# Patient Record
Sex: Female | Born: 1995 | Race: Black or African American | Hispanic: No | Marital: Single | Smoking: Never smoker
Health system: Southern US, Community
[De-identification: ages and names within clinical notes are randomized; demographics above are authoritative.]

---
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
## 2016-07-17 ENCOUNTER — Encounter (HOSPITAL_COMMUNITY): Payer: Self-pay

## 2016-07-17 ENCOUNTER — Emergency Department (HOSPITAL_COMMUNITY)
Admission: EM | Admit: 2016-07-17 | Discharge: 2016-07-17 | Disposition: A | Discharge status: Home / Self Care | Payer: Medicaid - Out of State | Attending: Emergency Medicine | Admitting: Emergency Medicine

## 2016-07-17 DIAGNOSIS — R21 Rash and other nonspecific skin eruption: Secondary | ICD-10-CM | POA: Diagnosis present

## 2016-07-17 DIAGNOSIS — L509 Urticaria, unspecified: Secondary | ICD-10-CM | POA: Insufficient documentation

## 2016-07-17 MED ORDER — DIPHENHYDRAMINE HCL 25 MG PO CAPS
25.0000 mg | ORAL_CAPSULE | Freq: Once | ORAL | Status: AC
  Administered 2016-07-17: 25 mg via ORAL
  Filled 2016-07-17: qty 1

## 2016-07-17 MED ORDER — LORATADINE 10 MG PO TABS
10.0000 mg | ORAL_TABLET | Freq: Once | ORAL | Status: AC
  Administered 2016-07-17: 10 mg via ORAL
  Filled 2016-07-17: qty 1

## 2016-07-17 MED ORDER — LORATADINE 10 MG PO TABS: 10.0000 mg | ORAL_TABLET | Freq: Every day | ORAL | 0 refills | Status: AC

## 2016-07-17 MED ORDER — DIPHENHYDRAMINE HCL 25 MG PO TABS: 25.0000 mg | ORAL_TABLET | Freq: Four times a day (QID) | ORAL | 0 refills | Status: AC

## 2016-07-17 NOTE — ED Triage Notes (Signed)
Pt states that she noticed hives on her stomach around 30 minutes ago. Denies allergies or use of new soaps. Denies pain. Hives almost gone, did not take anything PTA

## 2016-07-17 NOTE — ED Provider Notes (Signed)
[facility] Provider Note   CSN: [id number] Arrival date & time: 07/17/16  0120     History   Chief Complaint Chief Complaint  Patient presents with  . Rash    HPI Shelly Marsh is a 20 y.o. female.  HPI  Patient presents to the emergency department for evaluation of rash to abdomen. She does not know if she came into contact with noticed that her stomach was very itchy. Since then it has largely resolved. She does not know what she is allergic to. She is not had any shortness of breath, throat, lip or tongue swelling. She denied any nausea, vomiting, diarrhea. She denies having rash anywhere else.  History reviewed. No pertinent past medical history.  There are no active problems to display for this patient.   History reviewed. No pertinent surgical history.  OB History    No data available       Home Medications    Prior to Admission medications   Medication Sig Start Date End Date Taking? Authorizing Provider  albuterol (PROVENTIL HFA;VENTOLIN HFA) 108 (90 Base) MCG/ACT inhaler Inhale 1-2 puffs into the lungs every 6 (six) hours as needed for wheezing or shortness of breath.   Yes Historical Provider, MD  etonogestrel-ethinyl estradiol (NUVARING) 0.12-0.015 MG/24HR vaginal ring Place 1 each vaginally every 28 (twenty-eight) days. Insert vaginally and leave in place for 3 consecutive weeks, then remove for 1 week.   Yes Historical Provider, MD  metroNIDAZOLE (FLAGYL) 500 MG tablet Take 500 mg by mouth 2 (two) times daily.   Yes Historical Provider, MD  diphenhydrAMINE (BENADRYL) 25 MG tablet Take 1 tablet (25 mg total) by mouth every 6 (six) hours. 07/17/16   Alyda Megna Neva Seat, PA-C  loratadine (CLARITIN) 10 MG tablet Take 1 tablet (10 mg total) by mouth daily. 07/17/16   Marlon Pel, PA-C    Family History No family history on file.  Social History Social History  Substance Use Topics  . Smoking status: Never Smoker  . Smokeless tobacco: Never Used  .  Alcohol use No     Allergies   Review of patient's allergies indicates no known allergies.   Review of Systems Review of Systems  Review of Systems All other systems negative except as documented in the HPI. All pertinent positives and negatives as reviewed in the HPI.  Physical Exam Updated Vital Signs BP 133/91   Pulse 93   Temp 97.8 F (36.6 C)   Resp 18   Ht 5\' 4"  (1.626 m)   Wt 77.1 kg   LMP 05/15/2016   SpO2 99%   BMI 29.18 kg/m   Physical Exam  Constitutional: She appears well-developed and well-nourished. No distress.  HENT:  Head: Normocephalic and atraumatic.  Eyes: Pupils are equal, round, and reactive to light.  Neck: Normal range of motion. Neck supple.  Cardiovascular: Normal rate and regular rhythm.   Pulmonary/Chest: Effort normal.  Abdominal: Soft.  Neurological: She is alert.  Skin: Skin is warm and dry. Rash noted. Rash is urticarial (small amount of urticarial rash to abdomen).  Nursing note and vitals reviewed.   ED Treatments / Results  Labs (all labs ordered are listed, but only abnormal results are displayed) Labs Reviewed - No data to display  EKG  EKG Interpretation None       Radiology No results found.  Procedures Procedures (including critical care time)  Medications Ordered in ED Medications  diphenhydrAMINE (BENADRYL) capsule 25 mg (not administered)  loratadine (CLARITIN) tablet 10 mg (not  administered)     Initial Impression / Assessment and Plan / ED Course  I have reviewed the triage vital signs and the nursing notes.  Pertinent labs & imaging results that were available during my care of the patient were reviewed by me and considered in my medical decision making (see chart for details).  Clinical Course   Patient with urticarial eruption. No specific trigger. Discussed that urticaria can be trigger by stress heat cold and for unknown reasons. No signs of anaphylactic reaction; no new medications. Will treat  with . Follow up with PCP  in 2-3 days. Return precautions discussed. Pt is safe for discharge at this time.   Final Clinical Impressions(s) / ED Diagnoses   Final diagnoses:  Urticaria   New Prescriptions New Prescriptions   DIPHENHYDRAMINE (BENADRYL) 25 MG TABLET    Take 1 tablet (25 mg total) by mouth every 6 (six) hours.   LORATADINE (CLARITIN) 10 MG TABLET    Take 1 tablet (10 mg total) by mouth daily.     Marlon Peliffany Aletheia Tangredi, PA-C 07/17/16 0245    Gilda Creasehristopher J Pollina, MD 07/17/16 [phone]

## 2016-08-07 ENCOUNTER — Encounter (HOSPITAL_COMMUNITY): Payer: Self-pay | Admitting: Emergency Medicine

## 2016-08-07 ENCOUNTER — Emergency Department (HOSPITAL_COMMUNITY)
Admission: EM | Admit: 2016-08-07 | Discharge: 2016-08-07 | Disposition: A | Discharge status: Home / Self Care | Payer: Medicaid - Out of State | Attending: Emergency Medicine | Admitting: Emergency Medicine

## 2016-08-07 DIAGNOSIS — R51 Headache: Secondary | ICD-10-CM | POA: Diagnosis not present

## 2016-08-07 DIAGNOSIS — J4521 Mild intermittent asthma with (acute) exacerbation: Secondary | ICD-10-CM

## 2016-08-07 DIAGNOSIS — Y999 Unspecified external cause status: Secondary | ICD-10-CM | POA: Diagnosis not present

## 2016-08-07 DIAGNOSIS — R519 Headache, unspecified: Secondary | ICD-10-CM

## 2016-08-07 DIAGNOSIS — Y939 Activity, unspecified: Secondary | ICD-10-CM | POA: Insufficient documentation

## 2016-08-07 DIAGNOSIS — Y92410 Unspecified street and highway as the place of occurrence of the external cause: Secondary | ICD-10-CM | POA: Diagnosis not present

## 2016-08-07 DIAGNOSIS — Z79899 Other long term (current) drug therapy: Secondary | ICD-10-CM | POA: Diagnosis not present

## 2016-08-07 DIAGNOSIS — J45901 Unspecified asthma with (acute) exacerbation: Secondary | ICD-10-CM | POA: Insufficient documentation

## 2016-08-07 MED ORDER — ACETAMINOPHEN 325 MG PO TABS
650.0000 mg | ORAL_TABLET | Freq: Once | ORAL | Status: AC
  Administered 2016-08-07: 650 mg via ORAL
  Filled 2016-08-07: qty 2

## 2016-08-07 MED ORDER — PREDNISONE 50 MG PO TABS: 50.0000 mg | ORAL_TABLET | Freq: Every day | ORAL | 0 refills | Status: DC

## 2016-08-07 MED ORDER — PREDNISONE 20 MG PO TABS
60.0000 mg | ORAL_TABLET | Freq: Once | ORAL | Status: AC
  Administered 2016-08-07: 60 mg via ORAL
  Filled 2016-08-07: qty 3
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
## 2016-08-07 NOTE — Discharge Instructions (Signed)
Follow up with a primary care doctor, use the steroids with your inhaler, take tylenol or ibuprofen as needed for your headache
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
## 2017-09-09 ENCOUNTER — Encounter (HOSPITAL_COMMUNITY): Payer: Self-pay | Admitting: Family Medicine

## 2017-09-09 ENCOUNTER — Ambulatory Visit (HOSPITAL_COMMUNITY)
Admission: EM | Admit: 2017-09-09 | Discharge: 2017-09-09 | Disposition: A | Discharge status: Home / Self Care | Payer: Medicaid - Out of State | Attending: Family Medicine | Admitting: Family Medicine

## 2017-09-09 DIAGNOSIS — M26623 Arthralgia of bilateral temporomandibular joint: Secondary | ICD-10-CM

## 2017-09-09 MED ORDER — KETOROLAC TROMETHAMINE 30 MG/ML IJ SOLN
INTRAMUSCULAR | Status: AC
  Filled 2017-09-09: qty 1

## 2017-09-09 MED ORDER — PREDNISONE 10 MG (21) PO TBPK: ORAL_TABLET | Freq: Every day | ORAL | 0 refills | Status: AC

## 2017-09-09 MED ORDER — KETOROLAC TROMETHAMINE 30 MG/ML IJ SOLN
30.0000 mg | Freq: Once | INTRAMUSCULAR | Status: AC
  Administered 2017-09-09: 30 mg via INTRAMUSCULAR

## 2017-09-09 MED ORDER — DICLOFENAC SODIUM 75 MG PO TBEC: 75.0000 mg | DELAYED_RELEASE_TABLET | Freq: Two times a day (BID) | ORAL | 0 refills | Status: AC

## 2017-09-09 NOTE — ED Triage Notes (Signed)
Pt here for 8 days of jaw pain. Reports she believes it may be TMJ.

## 2017-09-14 NOTE — ED Provider Notes (Addendum)
  [facility]   [id number] 09/09/17 Arrival Time: 1913  ASSESSMENT & PLAN:  1. Bilateral temporomandibular joint pain    Meds ordered this encounter  Medications  . ketorolac (TORADOL) 30 MG/ML injection 30 mg  . predniSONE (STERAPRED UNI-PAK 21 TAB) 10 MG (21) TBPK tablet    Sig: Take by mouth daily. Take as directed.    Dispense:  21 tablet    Refill:  0  . diclofenac (VOLTAREN) 75 MG EC tablet    Sig: Take 1 tablet (75 mg total) by mouth 2 (two) times daily.    Dispense:  14 tablet    Refill:  0   Recommend f/u with oral surgeon if not improving. Reviewed expectations re: course of current medical issues. Questions answered. Outlined signs and symptoms indicating need for more acute intervention. Patient verbalized understanding. After Visit Summary given.   SUBJECTIVE:  Shelly Marsh is a 21 y.o. female who presents with complaint of jaw discomfort bilaterally for the past 1-2 weeks. Gradual onset. No injury. Similar symptoms in the past but usu don't last this long. Ibuprofen with mild help. Chewing exacerbates. No dental or oral surgeon evaluation. Tolerating PO intake. No fever. No neck pain.  ROS: As per HPI.   OBJECTIVE:  Vitals:   09/09/17 1931  BP: (!) 145/96  Resp: 18  Temp: 98.2 F (36.8 C)  SpO2: 100%    General appearance: alert; no distress Eyes: PERRLA; EOMI; conjunctiva normal  HENT: normocephalic; atraumatic; TMs normal; nasal mucosa normal; oral mucosa normal; tender over bilateral TM joints but able to open jaw fully with discomfort Neck: supple Skin: warm and dry Psychological: alert and cooperative; normal mood and affect   Allergies  Allergen Reactions  . Latex Other (See Comments)    Social History   Social History  . Marital status: Single    Spouse name: N/A  . Number of children: N/A  . Years of education: N/A   Occupational History  . Not on file.   Social History Main Topics  . Smoking status: Never  Smoker  . Smokeless tobacco: Never Used  . Alcohol use No  . Drug use: Yes    Types: Marijuana  . Sexual activity: Not on file   Other Topics Concern  . Not on file   Social History Narrative  . No narrative on file      Mardella LaymanHagler, Sheily Lineman, MD 09/14/17 [id number]    Mardella LaymanHagler, Deby Adger, MD 09/14/17 [phone]

## 2018-09-19 ENCOUNTER — Ambulatory Visit (HOSPITAL_COMMUNITY)
Admission: EM | Admit: 2018-09-19 | Discharge: 2018-09-19 | Disposition: A | Discharge status: Home / Self Care | Payer: Self-pay | Attending: Family Medicine | Admitting: Family Medicine

## 2018-09-19 ENCOUNTER — Encounter (HOSPITAL_COMMUNITY): Payer: Self-pay | Admitting: *Deleted

## 2018-09-19 ENCOUNTER — Other Ambulatory Visit: Payer: Self-pay

## 2018-09-19 DIAGNOSIS — K148 Other diseases of tongue: Secondary | ICD-10-CM

## 2018-09-19 DIAGNOSIS — N898 Other specified noninflammatory disorders of vagina: Secondary | ICD-10-CM

## 2018-09-19 MED ORDER — METRONIDAZOLE 500 MG PO TABS: 500.0000 mg | ORAL_TABLET | Freq: Two times a day (BID) | ORAL | 0 refills | Status: AC

## 2018-09-19 MED ORDER — METRONIDAZOLE 500 MG PO TABS: 500.0000 mg | ORAL_TABLET | Freq: Two times a day (BID) | ORAL | 0 refills | Status: DC

## 2018-09-19 MED ORDER — CHLORHEXIDINE GLUCONATE 0.12 % MT SOLN: 15.0000 mL | Freq: Two times a day (BID) | OROMUCOSAL | 0 refills | Status: AC

## 2018-09-19 MED ORDER — CHLORHEXIDINE GLUCONATE 0.12 % MT SOLN: 15.0000 mL | Freq: Two times a day (BID) | OROMUCOSAL | 0 refills | Status: DC

## 2018-09-19 NOTE — Discharge Instructions (Signed)
Start peridex as directed. Continue to monitor tongue lesions, follow up with dentist for further evaluation needed.  Start flagyl as directed for Bv. Refrain from sexual activity and alcohol use for the next 7 days. Monitor for any worsening of symptoms, fever, abdominal pain, nausea, vomiting, to follow up for reevaluation.

## 2018-09-19 NOTE — ED Triage Notes (Signed)
C/o sore throat onset last pm states she noticed bumps on the throat.

## 2018-09-19 NOTE — ED Provider Notes (Signed)
[facility]    CSN: [id number] Arrival date & time: 09/19/18  1613     History   Chief Complaint Chief Complaint  Patient presents with  . Sore Throat    HPI Ronnell Makarewicz is a 22 y.o. female.   22 year old female comes in for 2 day history of tongue irritation and lesion. States noticed mild irritation, and noticed the lesion. Unsure if the lesion had been there prior to symptom onset. Denies worsening pain, injury/trauma to the tongue. Denies trouble swallowing, trouble breathing, tripoding, drooling. Denies fever, chills, night sweats. States normally has nasal congestion and rhinorrhea.   Also complains of few day history of vaginal discharge with odor. Denies vaginal pain or itching. Denies urinary symptoms such as frequency, dysuria, hematuria. Denies abdominal pain, nausea, vomiting. Sexually active with one female partner, occasional condom use. LMP 08/29/2018. Not worried about STDs.      History reviewed. No pertinent past medical history.  There are no active problems to display for this patient.   History reviewed. No pertinent surgical history.  OB History   None      Home Medications    Prior to Admission medications   Medication Sig Start Date End Date Taking? Authorizing Provider  albuterol (PROVENTIL HFA;VENTOLIN HFA) 108 (90 Base) MCG/ACT inhaler Inhale 1-2 puffs into the lungs every 6 (six) hours as needed for wheezing or shortness of breath.    [provider]  chlorhexidine (PERIDEX) 0.12 % solution Use as directed 15 mLs in the mouth or throat 2 (two) times daily. 09/19/18   Cathie Hoops,  V, PA-C  diclofenac (VOLTAREN) 75 MG EC tablet Take 1 tablet (75 mg total) by mouth 2 (two) times daily. 09/09/17   Mardella Layman, MD  diphenhydrAMINE (BENADRYL) 25 MG tablet Take 1 tablet (25 mg total) by mouth every 6 (six) hours. Patient not taking: Reported on 08/07/2016 07/17/16   Marlon Pel, PA-C  etonogestrel-ethinyl estradiol (NUVARING)  0.12-0.015 MG/24HR vaginal ring Place 1 each vaginally every 28 (twenty-eight) days. Insert vaginally and leave in place for 3 consecutive weeks, then remove for 1 week.    [provider]  loratadine (CLARITIN) 10 MG tablet Take 1 tablet (10 mg total) by mouth daily. Patient not taking: Reported on 08/07/2016 07/17/16   Marlon Pel, PA-C  metroNIDAZOLE (FLAGYL) 500 MG tablet Take 1 tablet (500 mg total) by mouth 2 (two) times daily. 09/19/18   Cathie Hoops,  V, PA-C  predniSONE (STERAPRED UNI-PAK 21 TAB) 10 MG (21) TBPK tablet Take by mouth daily. Take as directed. 09/09/17   Mardella Layman, MD    Family History No family history on file.  Social History Social History   Tobacco Use  . Smoking status: Never Smoker  . Smokeless tobacco: Never Used  Substance Use Topics  . Alcohol use: Yes  . Drug use: Yes    Types: Marijuana     Allergies   Latex   Review of Systems Review of Systems  Reason unable to perform ROS: See HPI as above.     Physical Exam Triage Vital Signs ED Triage Vitals  Enc Vitals Group     BP 09/19/18 1643 127/68     Pulse Rate 09/19/18 1643 79     Resp 09/19/18 1643 16     Temp 09/19/18 1643 98.1 F (36.7 C)     Temp Source 09/19/18 1643 Oral     SpO2 09/19/18 1643 100 %     Weight --  Height --      Head Circumference --      Peak Flow --      Pain Score 09/19/18 1645 5     Pain Loc --      Pain Edu? --      Excl. in GC? --    No data found.  Updated Vital Signs BP 127/68 (BP Location: Right Arm)   Pulse 79   Temp 98.1 F (36.7 C) (Oral)   Resp 16   LMP 08/29/2018 (Approximate)   SpO2 100%      Physical Exam  Constitutional: She is oriented to person, place, and time. She appears well-developed and well-nourished.  Non-toxic appearance. She does not appear ill. No distress.  HENT:  Head: Normocephalic and atraumatic.  Mouth/Throat: Uvula is midline, oropharynx is clear and moist and mucous membranes are normal.    Eyes:  Pupils are equal, round, and reactive to light. Conjunctivae are normal.  Cardiovascular: Normal rate and regular rhythm. Exam reveals no gallop and no friction rub.  No murmur heard. Pulmonary/Chest: Effort normal and breath sounds normal. No respiratory distress. She has no wheezes. She has no rhonchi. She has no rales.  Abdominal: Soft. Bowel sounds are normal. There is no tenderness. There is no rebound and no guarding.  Neurological: She is alert and oriented to person, place, and time.  Skin: Skin is warm and dry. She is not diaphoretic.     UC Treatments / Results  Labs (all labs ordered are listed, but only abnormal results are displayed) Labs Reviewed - No data to display  EKG None  Radiology No results found.  Procedures Procedures (including critical care time)  Medications Ordered in UC Medications - No data to display  Initial Impression / Assessment and Plan / UC Course  I have reviewed the triage vital signs and the nursing notes.  Pertinent labs & imaging results that were available during my care of the patient were reviewed by me and considered in my medical decision making (see chart for details).    Will have patient use peridex for the mouth. Follow up with dentist for further evaluation if symptoms not improving.   Patient treated empirically for BV, flagyl as directed. Patient declined cytology. Return precautions given.  Final Clinical Impressions(s) / UC Diagnoses   Final diagnoses:  Tongue lesion  Vaginal discharge    ED Prescriptions    Medication Sig Dispense Auth. Provider   chlorhexidine (PERIDEX) 0.12 % solution  (Status: Discontinued) Use as directed 15 mLs in the mouth or throat 2 (two) times daily. 120 mL ,  V, PA-C   metroNIDAZOLE (FLAGYL) 500 MG tablet  (Status: Discontinued) Take 1 tablet (500 mg total) by mouth 2 (two) times daily. 14 tablet ,  V, PA-C   chlorhexidine (PERIDEX) 0.12 % solution Use as directed 15 mLs in  the mouth or throat 2 (two) times daily. 120 mL ,  V, PA-C   metroNIDAZOLE (FLAGYL) 500 MG tablet Take 1 tablet (500 mg total) by mouth 2 (two) times daily. 14 tablet Threasa Alpha, PA-C 09/19/18 1800

## 2019-06-09 ENCOUNTER — Encounter (HOSPITAL_COMMUNITY): Payer: Self-pay | Admitting: Emergency Medicine

## 2019-06-09 ENCOUNTER — Emergency Department (HOSPITAL_COMMUNITY)
Admission: EM | Admit: 2019-06-09 | Discharge: 2019-06-10 | Disposition: A | Discharge status: Home / Self Care | Payer: Self-pay | Attending: Emergency Medicine | Admitting: Emergency Medicine

## 2019-06-09 ENCOUNTER — Emergency Department (HOSPITAL_COMMUNITY): Payer: Self-pay

## 2019-06-09 DIAGNOSIS — Z79899 Other long term (current) drug therapy: Secondary | ICD-10-CM | POA: Insufficient documentation

## 2019-06-09 DIAGNOSIS — X509XXA Other and unspecified overexertion or strenuous movements or postures, initial encounter: Secondary | ICD-10-CM | POA: Insufficient documentation

## 2019-06-09 DIAGNOSIS — Y92039 Unspecified place in apartment as the place of occurrence of the external cause: Secondary | ICD-10-CM | POA: Insufficient documentation

## 2019-06-09 DIAGNOSIS — Y9389 Activity, other specified: Secondary | ICD-10-CM | POA: Insufficient documentation

## 2019-06-09 DIAGNOSIS — Y999 Unspecified external cause status: Secondary | ICD-10-CM | POA: Insufficient documentation

## 2019-06-09 DIAGNOSIS — S93402A Sprain of unspecified ligament of left ankle, initial encounter: Secondary | ICD-10-CM | POA: Insufficient documentation

## 2019-06-09 DIAGNOSIS — Z91040 Latex allergy status: Secondary | ICD-10-CM | POA: Insufficient documentation

## 2019-06-09 MED ORDER — HYDROCODONE-ACETAMINOPHEN 5-325 MG PO TABS
1.0000 | ORAL_TABLET | Freq: Once | ORAL | Status: AC
  Administered 2019-06-10: 1 via ORAL
  Filled 2019-06-09: qty 1

## 2019-06-09 NOTE — ED Triage Notes (Signed)
Patient is complaining of left foot and left ankle pain. Patient states that she was going up the stairs at her apartment moving in and twisted her foot and ankle. Patient states it hurts like hell to walk on.

## 2019-06-09 NOTE — ED Provider Notes (Signed)
[facility]-EMERGENCY DEPT Provider Note   CSN: [id number] Arrival date & time: 06/09/19  2242    History   Chief Complaint Chief Complaint  Patient presents with  . Ankle Injury  . Foot Injury    HPI Shelly Marsh is a 23 y.o. female.     HPI   Patient is a 23 year old female who presents the emergency department today for evaluation of left foot/ankle injury.  States that she was moving into an apartment earlier today and 1 of the steps at her apartment complex was broken.  She twisted her ankle causing sudden onset of severe pain.  Pain is Marsh constant since onset.  It is worse with weightbearing.  Denies any numbness to the foot.  She denies falling to the ground or having any head trauma.  History reviewed. No pertinent past medical history.  There are no active problems to display for this patient.   History reviewed. No pertinent surgical history.   OB History   No obstetric history on file.     Home Medications    Prior to Admission medications   Medication Sig Start Date End Date Taking? Authorizing Provider  albuterol (PROVENTIL HFA;VENTOLIN HFA) 108 (90 Base) MCG/ACT inhaler Inhale 1-2 puffs into the lungs every 6 (six) hours as needed for wheezing or shortness of breath.    [provider]  chlorhexidine (PERIDEX) 0.12 % solution Use as directed 15 mLs in the mouth or throat 2 (two) times daily. 09/19/18   Cathie HoopsYu, Amy V, PA-C  diclofenac (VOLTAREN) 75 MG EC tablet Take 1 tablet (75 mg total) by mouth 2 (two) times daily. 09/09/17   Mardella LaymanHagler, Brian, MD  diphenhydrAMINE (BENADRYL) 25 MG tablet Take 1 tablet (25 mg total) by mouth every 6 (six) hours. Patient not taking: Reported on 08/07/2016 07/17/16   Marlon PelGreene, Tiffany, PA-C  etonogestrel-ethinyl estradiol (NUVARING) 0.12-0.015 MG/24HR vaginal ring Place 1 each vaginally every 28 (twenty-eight) days. Insert vaginally and leave in place for 3 consecutive weeks, then remove for 1 week.     [provider]  loratadine (CLARITIN) 10 MG tablet Take 1 tablet (10 mg total) by mouth daily. Patient not taking: Reported on 08/07/2016 07/17/16   Marlon PelGreene, Tiffany, PA-C  metroNIDAZOLE (FLAGYL) 500 MG tablet Take 1 tablet (500 mg total) by mouth 2 (two) times daily. 09/19/18   Cathie HoopsYu, Amy V, PA-C  predniSONE (STERAPRED UNI-PAK 21 TAB) 10 MG (21) TBPK tablet Take by mouth daily. Take as directed. 09/09/17   Mardella LaymanHagler, Brian, MD    Family History History reviewed. No pertinent family history.  Social History Social History   Tobacco Use  . Smoking status: Never Smoker  . Smokeless tobacco: Never Used  Substance Use Topics  . Alcohol use: Yes  . Drug use: Yes    Types: Marijuana     Allergies   Latex   Review of Systems Review of Systems  Musculoskeletal:       Left foot/ankle pain  Neurological: Negative for weakness and numbness.     Physical Exam Updated Vital Signs BP 134/89 (BP Location: Right Arm)   Pulse 95   Temp 98.7 F (37.1 C) (Oral)   Resp 18   Ht 5\' 5"  (1.651 m)   Wt 72.6 kg   LMP 06/09/2019   SpO2 100%   BMI 26.63 kg/m   Physical Exam Vitals signs and nursing note reviewed.  Constitutional:      General: She is not in acute distress.  Appearance: She is well-developed.  HENT:     Head: Normocephalic and atraumatic.  Eyes:     Conjunctiva/sclera: Conjunctivae normal.  Neck:     Musculoskeletal: Neck supple.  Cardiovascular:     Rate and Rhythm: Normal rate.  Pulmonary:     Effort: Pulmonary effort is normal.  Musculoskeletal: Normal range of motion.     Comments: TTP to the medial and lateral malleoli, greater to the lateral malleolus.  TTP to the left lateral foot with some mild swelling.  Able to dorsiflex and plantarflex foot.  Normal sensation distally.  No tenderness to proximal tib/fib.  Skin:    General: Skin is warm and dry.  Neurological:     Mental Status: She is alert.      ED Treatments / Results  Labs (all labs  ordered are listed, but only abnormal results are displayed) Labs Reviewed - No data to display  EKG None  Radiology Dg Ankle Complete Left  Result Date: 06/09/2019 CLINICAL DATA:  Left ankle pain following twisting injury, initial encounter EXAM: LEFT ANKLE COMPLETE - 3+ VIEW COMPARISON:  None. FINDINGS: There is no evidence of fracture, dislocation, or joint effusion. There is no evidence of arthropathy or other focal bone abnormality. Soft tissues are unremarkable. IMPRESSION: No acute abnormality noted. Electronically Signed   By: Alcide CleverMark  Lukens M.D.   On: 06/09/2019 23:27   Dg Foot Complete Left  Result Date: 06/09/2019 CLINICAL DATA:  Twisting injury earlier today, initial encounter EXAM: LEFT FOOT - COMPLETE 3+ VIEW COMPARISON:  None. FINDINGS: There is no evidence of fracture or dislocation. There is no evidence of arthropathy or other focal bone abnormality. Soft tissues are unremarkable. IMPRESSION: No acute abnormality noted. Electronically Signed   By: Alcide CleverMark  Lukens M.D.   On: 06/09/2019 23:26    Procedures Procedures (including critical care time) SPLINT APPLICATION Date/Time: 12:10 AM Authorized by: Karrie Meresortni S Nirvaan Frett Consent: Verbal consent obtained. Risks and benefits: risks, benefits and alternatives were discussed Consent given by: patient Splint applied by: orthopedic technician Location details: RLE  Splint type: ASO Supplies used: ASO Post-procedure: The splinted body part was neurovascularly unchanged following the procedure. Patient tolerance: Patient tolerated the procedure well with no immediate complications.  Medications Ordered in ED Medications  HYDROcodone-acetaminophen (NORCO/VICODIN) 5-325 MG per tablet 1 tablet (1 tablet Oral Given 06/10/19 0007)     Initial Impression / Assessment and Plan / ED Course  I have reviewed the triage vital signs and the nursing notes.  Pertinent labs & imaging results that were available during my care of the patient  were reviewed by me and considered in my medical decision making (see chart for details).     Final Clinical Impressions(s) / ED Diagnoses   Final diagnoses:  Sprain of left ankle, unspecified ligament, initial encounter   Patient presenting with ankle pain after twisting ankle prior to arrival.  Vital signs stable and patient nontoxic-appearing.  X-ray of left foot and ankle negative for acute fracture abnormality.  A splint was applied and crutches given.  OrthO follow-up given and patient advised to follow-up with either PCP or orthopedics in 1 week for reevaluation.  Advised Tylenol, ibuprofen, and rice protocol for pain.  Advised to return to the ER for any new or worsening symptoms in the meantime.  All questions were answered and patient understands plan and reasons to return.    ED Discharge Orders    None       Karrie MeresCouture, Hanan Moen S, [place]PA-C 06/10/19 0010  Sherwood Gambler, MD 06/10/19 [phone]

## 2019-06-10 ENCOUNTER — Other Ambulatory Visit: Payer: Self-pay

## 2019-06-10 NOTE — Discharge Instructions (Addendum)
You may alternate taking Tylenol and Ibuprofen as needed for pain control. You may take 400-600 mg of ibuprofen every 6 hours and [phone] mg of Tylenol every 6 hours. Do not exceed 4000 mg of Tylenol daily as this can lead to liver damage. Also, make sure to take Ibuprofen with meals as it can cause an upset stomach. Do not take other NSAIDs while taking Ibuprofen such as (Aleve, Naprosyn, Aspirin, Celebrex, etc) and do not take more than the prescribed dose as this can lead to ulcers and bleeding in your GI tract. You may use warm and cold compresses to help with your symptoms.   You were given information to follow up with an orthopedic doctor if you are continuing to have ankle pain. You may call the office the schedule an appointment if your symptoms persist or worsen.   Please return to the ER sooner if you have any new or worsening symptoms.

## 2019-07-28 ENCOUNTER — Other Ambulatory Visit: Payer: Self-pay

## 2019-07-28 ENCOUNTER — Ambulatory Visit: Payer: PRIVATE HEALTH INSURANCE | Admitting: Podiatry

## 2020-06-27 IMAGING — CR LEFT FOOT - COMPLETE 3+ VIEW
3 series · 3 of 3 positions shown · non-contrast
Comparison: None.

CLINICAL DATA: Twisting injury earlier today, initial encounter

EXAM:
LEFT FOOT - COMPLETE 3+ VIEW

[x foot lat left]
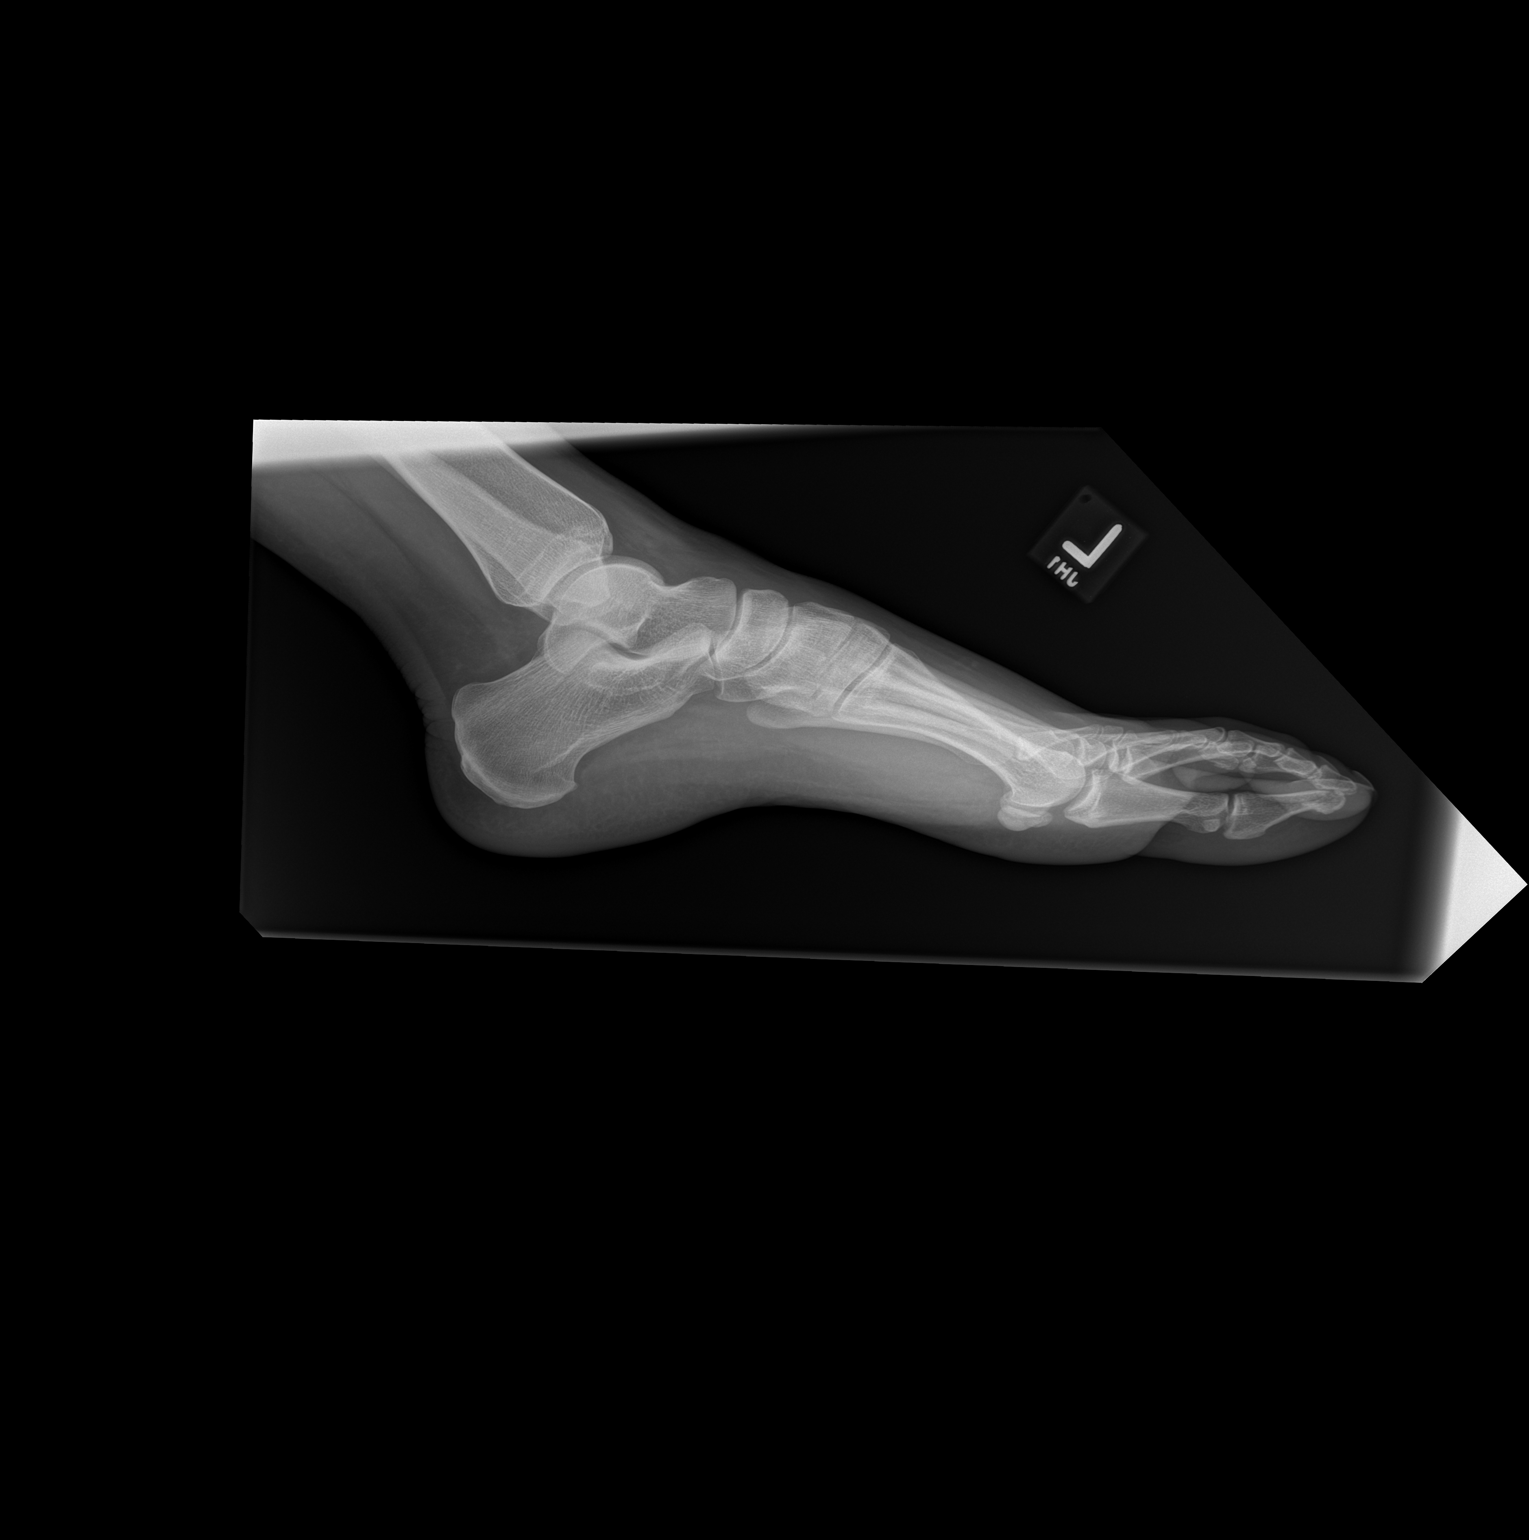

[x foot ap left]
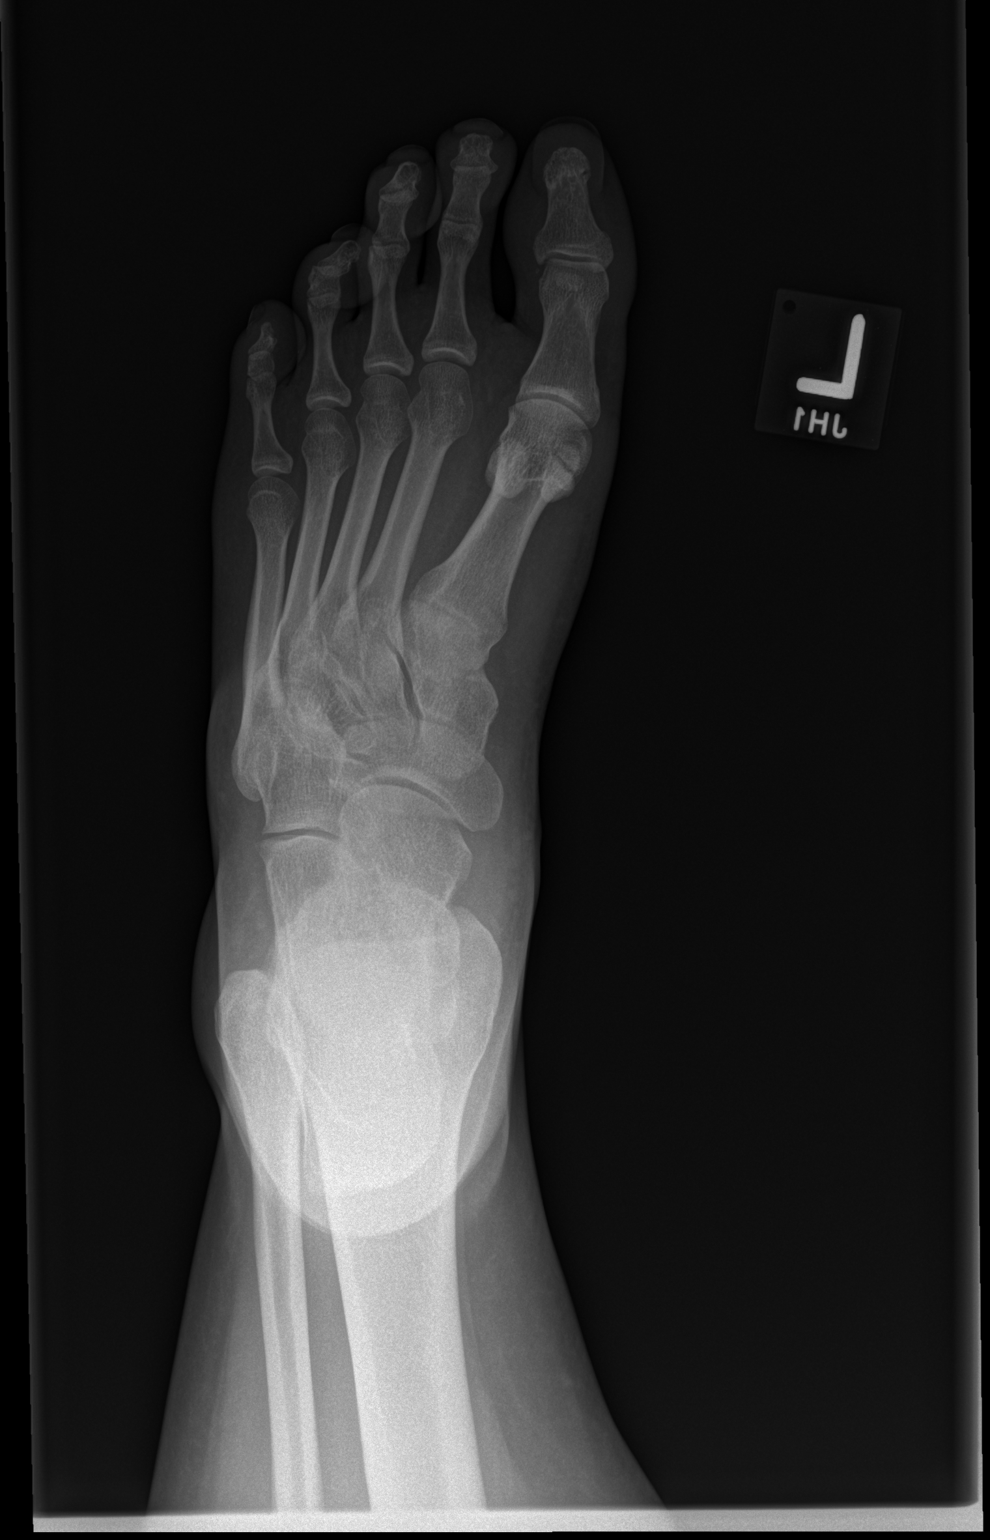

[x foot obl left]
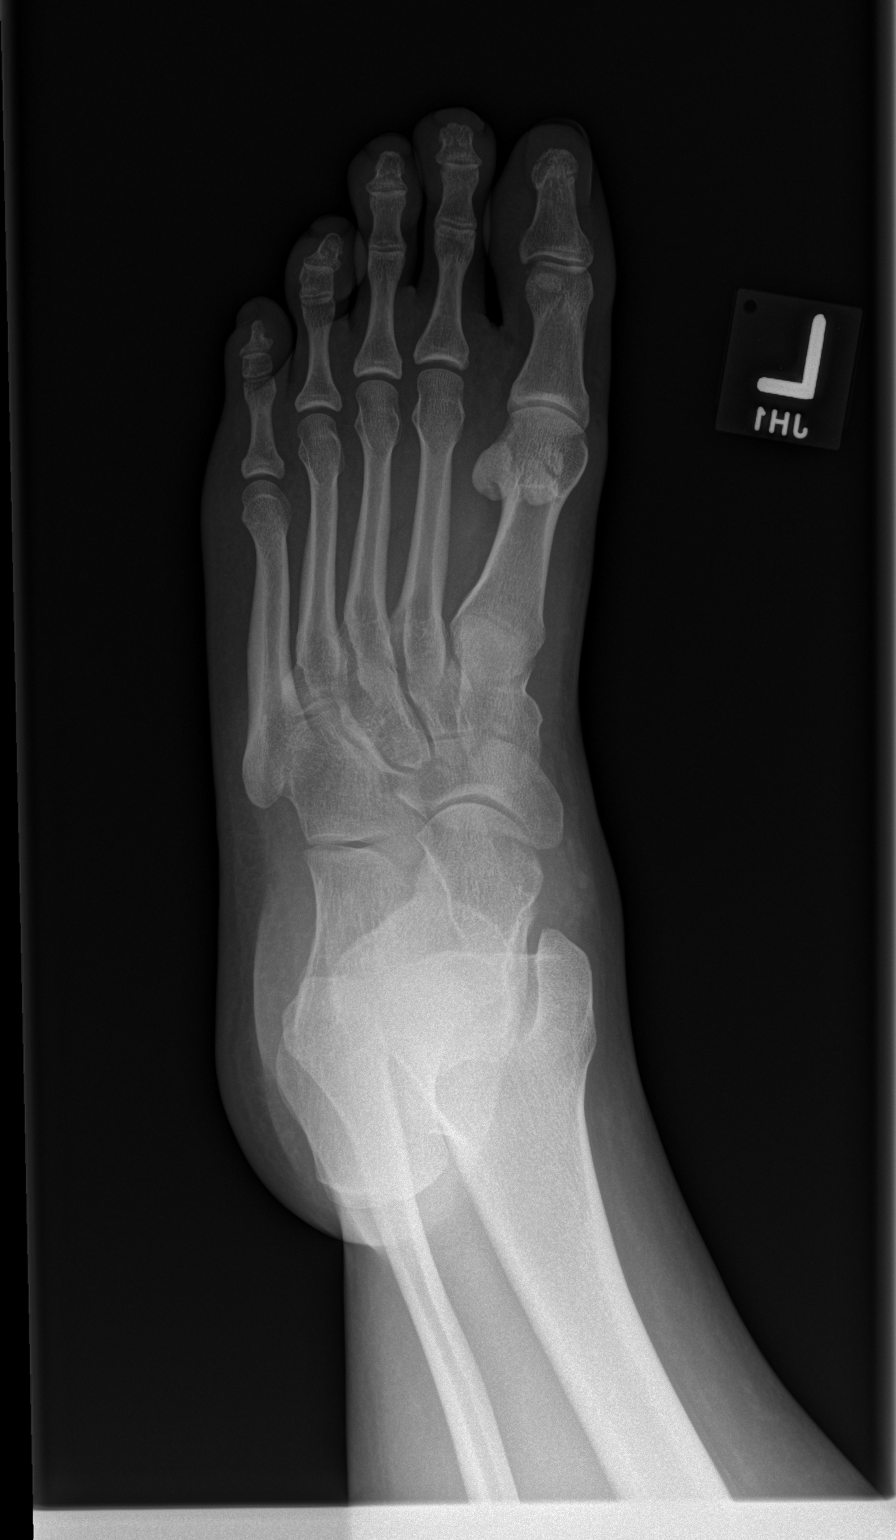

[3 of 3 positions shown; findings below may reference images not displayed]

FINDINGS: There is no evidence of fracture or dislocation. There is no
evidence of arthropathy or other focal bone abnormality. Soft
tissues are unremarkable.
IMPRESSION: No acute abnormality noted.
